# Patient Record
Sex: Female | Born: 1995 | Race: Black or African American | Hispanic: No | Marital: Single | Smoking: Current every day smoker
Health system: Southern US, Community
[De-identification: ages and names within clinical notes are randomized; demographics above are authoritative.]

## PROBLEM LIST (undated history)

## (undated) ENCOUNTER — Inpatient Hospital Stay: Payer: Self-pay

## (undated) DIAGNOSIS — K219 Gastro-esophageal reflux disease without esophagitis: Secondary | ICD-10-CM

## (undated) DIAGNOSIS — F419 Anxiety disorder, unspecified: Secondary | ICD-10-CM

## (undated) DIAGNOSIS — J45909 Unspecified asthma, uncomplicated: Secondary | ICD-10-CM

## (undated) DIAGNOSIS — F329 Major depressive disorder, single episode, unspecified: Secondary | ICD-10-CM

## (undated) DIAGNOSIS — F32A Depression, unspecified: Secondary | ICD-10-CM

## (undated) HISTORY — DX: Anxiety disorder, unspecified: F41.9

## (undated) HISTORY — DX: Gastro-esophageal reflux disease without esophagitis: K21.9

## (undated) HISTORY — DX: Depression, unspecified: F32.A

## (undated) HISTORY — DX: Major depressive disorder, single episode, unspecified: F32.9

## (undated) HISTORY — DX: Unspecified asthma, uncomplicated: J45.909

---
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
## 2015-01-21 ENCOUNTER — Telehealth: Payer: Self-pay | Admitting: Obstetrics and Gynecology

## 2015-01-21 NOTE — Telephone Encounter (Signed)
Needs work note to release her to go back to work, she has appt with [facility] in November for her 6 week post partum however the baby is well over 453 months old and pt never showed for er 6 week post partum visit, but pt stated she needs to go back to work, pt calling and wanting a release back to work note and it needs to state pts name and the day she had her baby, and the end date to be 01/21/15. This is all per pt. Pt stated she needs this done today.

## 2015-01-22 ENCOUNTER — Other Ambulatory Visit: Payer: Self-pay | Admitting: *Deleted

## 2015-01-22 NOTE — Telephone Encounter (Signed)
Note provided for pt, Nicole Goodman faxed to pt

## 2015-01-22 NOTE — Telephone Encounter (Signed)
This pt never showed for her 6 week post partum test, does she need to be seen before note can be giving

## 2015-01-22 NOTE — Telephone Encounter (Signed)
OK to give note , but make sure she knows importance of coming for appointment

## 2015-02-14 ENCOUNTER — Ambulatory Visit: Payer: Medicaid Other | Admitting: Obstetrics and Gynecology

## 2015-12-09 ENCOUNTER — Encounter: Payer: Self-pay | Admitting: Emergency Medicine

## 2015-12-09 ENCOUNTER — Emergency Department
Admission: EM | Admit: 2015-12-09 | Discharge: 2015-12-09 | Disposition: A | Discharge status: Home / Self Care | Payer: Medicaid Other | Attending: Emergency Medicine | Admitting: Emergency Medicine

## 2015-12-09 ENCOUNTER — Ambulatory Visit
Admission: EM | Admit: 2015-12-09 | Discharge: 2015-12-09 | Disposition: A | Discharge status: Home / Self Care | Payer: Medicaid Other | Attending: Internal Medicine | Admitting: Internal Medicine

## 2015-12-09 DIAGNOSIS — Z79899 Other long term (current) drug therapy: Secondary | ICD-10-CM | POA: Insufficient documentation

## 2015-12-09 DIAGNOSIS — J45909 Unspecified asthma, uncomplicated: Secondary | ICD-10-CM | POA: Insufficient documentation

## 2015-12-09 DIAGNOSIS — N939 Abnormal uterine and vaginal bleeding, unspecified: Secondary | ICD-10-CM

## 2015-12-09 DIAGNOSIS — F17210 Nicotine dependence, cigarettes, uncomplicated: Secondary | ICD-10-CM | POA: Insufficient documentation

## 2015-12-09 DIAGNOSIS — Z3A09 9 weeks gestation of pregnancy: Secondary | ICD-10-CM | POA: Diagnosis not present

## 2015-12-09 DIAGNOSIS — N898 Other specified noninflammatory disorders of vagina: Secondary | ICD-10-CM | POA: Insufficient documentation

## 2015-12-09 DIAGNOSIS — O209 Hemorrhage in early pregnancy, unspecified: Secondary | ICD-10-CM | POA: Insufficient documentation

## 2015-12-09 LAB — HCG, QUANTITATIVE, PREGNANCY: hCG, Beta Chain, Quant, S: 33665 m[IU]/mL — ABNORMAL HIGH (ref <5)

## 2015-12-09 LAB — CBC WITH DIFFERENTIAL/PLATELET
Basophils Absolute: 0.1 10*3/uL (ref 0–0.1)
Basophils Relative: 1 %
EOS ABS: 0.1 10*3/uL (ref 0–0.7)
Eosinophils Relative: 1 %
HCT: 37.2 % (ref 35.0–47.0)
Hemoglobin: 13 g/dL (ref 12.0–16.0)
LYMPHS ABS: 3.1 10*3/uL (ref 1.0–3.6)
Lymphocytes Relative: 29 %
MCH: 29.6 pg (ref 26.0–34.0)
MCHC: 34.9 g/dL (ref 32.0–36.0)
MCV: 84.9 fL (ref 80.0–100.0)
MONO ABS: 0.8 10*3/uL (ref 0.2–0.9)
MONOS PCT: 7 %
NEUTROS PCT: 62 %
Neutro Abs: 6.7 10*3/uL — ABNORMAL HIGH (ref 1.4–6.5)
PLATELETS: 210 10*3/uL (ref 150–440)
RBC: 4.39 MIL/uL (ref 3.80–5.20)
RDW: 14.8 % — AB (ref 11.5–14.5)
WBC: 10.7 10*3/uL (ref 3.6–11.0)

## 2015-12-09 LAB — BASIC METABOLIC PANEL
Anion gap: 6 (ref 5–15)
BUN: 8 mg/dL (ref 6–20)
CO2: 26 mmol/L (ref 22–32)
CREATININE: 0.62 mg/dL (ref 0.44–1.00)
Calcium: 9.3 mg/dL (ref 8.9–10.3)
Chloride: 105 mmol/L (ref 101–111)
GFR calc Af Amer: >60 mL/min (ref >60)
Glucose, Bld: 90 mg/dL (ref 65–99)
POTASSIUM: 4.1 mmol/L (ref 3.5–5.1)
SODIUM: 137 mmol/L (ref 135–145)

## 2015-12-09 MED ORDER — IRON (FERROUS GLUCONATE) 256 (28 FE) MG PO TABS
256.0000 mg | ORAL_TABLET | Freq: Every day | ORAL | 1 refills | Status: DC
Start: 2015-12-09 — End: 2016-03-02

## 2015-12-09 MED ORDER — SODIUM CHLORIDE 0.9 % IV BOLUS (SEPSIS)
1000.0000 mL | Freq: Once | INTRAVENOUS | Status: AC
  Administered 2015-12-09: 1000 mL via INTRAVENOUS

## 2015-12-09 MED ORDER — KETOROLAC TROMETHAMINE 30 MG/ML IJ SOLN
30.0000 mg | Freq: Once | INTRAMUSCULAR | Status: AC
  Administered 2015-12-09: 30 mg via INTRAVENOUS
  Filled 2015-12-09: qty 1

## 2015-12-09 NOTE — ED Notes (Signed)
Pt reports she had an abortion 2 days ago and now has increased vaginal bleeding and cramping in lower abdomen.  States 2 pads an hour.  Pt also reports some dizziness today.  Pt alert.  Skin warm and dry.

## 2015-12-09 NOTE — ED Provider Notes (Signed)
[facility]    CSN: [id number] Arrival date & time: 12/09/15  1924  First Provider Contact:  None       History   Chief Complaint Chief Complaint  Patient presents with  . Vaginal Bleeding    HPI Nicole Goodman is a 20 y.o. female.   20 year old female presents to urgent care complaining of heavy vaginal bleeding. The patient underwent an abortion this weekend and states that she has been passing clots the size of a lemon. She is having to change her pad every 2 hours. She has intermittent lightheadedness but denies complaints otherwise.      Past Medical History:  Diagnosis Date  . Anxiety   . Asthma   . Depression   . GERD (gastroesophageal reflux disease)     Patient Active Problem List   Diagnosis Date Noted  . Vaginal delivery 10/02/2014  . Labor and delivery, indication for care 09/30/2014    History reviewed. No pertinent surgical history.  OB History    Gravida Para Term Preterm AB Living   1 1   1   1    SAB TAB Ectopic Multiple Live Births         0 1       Home Medications    Prior to Admission medications   Medication Sig Start Date End Date Taking? Authorizing Provider  Cholecalciferol (VITAMIN D) 2000 UNITS CAPS Take 1 capsule (2,000 Units total) by mouth daily. 10/04/14   Melody N Shambley, CNM  Iron-FA-B Cmp-C-Biot-Probiotic (FUSION PLUS) CAPS Take 1 capsule by mouth daily.    Historical Provider, MD  Prenatal Vit-Fe Fumarate-FA (PRENATAL MULTIVITAMIN) TABS tablet Take 1 tablet by mouth daily.     Historical Provider, MD  sertraline (ZOLOFT) 50 MG tablet Take 50 mg by mouth daily.    Historical Provider, MD    Family History Family History  Problem Relation Age of Onset  . Diabetes Mother   . Cancer Paternal Aunt     breast    Social History Social History  Substance Use Topics  . Smoking status: Never Smoker  . Smokeless tobacco: Never Used  . Alcohol use No     Allergies   Review of patient's allergies  indicates no known allergies.   Review of Systems Review of Systems  Genitourinary: Positive for vaginal bleeding.     Physical Exam Triage Vital Signs ED Triage Vitals  Enc Vitals Group     BP 12/09/15 1940 115/65     Pulse Rate 12/09/15 1940 88     Resp 12/09/15 1940 16     Temp 12/09/15 1940 98.7 F (37.1 C)     Temp Source 12/09/15 1940 Tympanic     SpO2 12/09/15 1940 100 %     Weight 12/09/15 1938 154 lb (69.9 kg)     Height 12/09/15 1938 5\' 4"  (1.626 m)     Head Circumference --      Peak Flow --      Pain Score 12/09/15 1941 8     Pain Loc --      Pain Edu? --      Excl. in GC? --    No data found.   Updated Vital Signs BP 115/65 (BP Location: Left Arm)   Pulse 88   Temp 98.7 F (37.1 C) (Tympanic)   Resp 16   Ht 5\' 4"  (1.626 m)   Wt 154 lb (69.9 kg)   SpO2 100%   BMI 26.43 kg/m  Visual Acuity Right Eye Distance:   Left Eye Distance:   Bilateral Distance:    Right Eye Near:   Left Eye Near:    Bilateral Near:     Physical Exam  Abdominal: Soft. Bowel sounds are normal.     UC Treatments / Results  Labs (all labs ordered are listed, but only abnormal results are displayed) Labs Reviewed - No data to display  EKG  EKG Interpretation None       Radiology No results found.  Procedures Procedures (including critical care time)  Medications Ordered in UC Medications - No data to display   Initial Impression / Assessment and Plan / UC Course  I have reviewed the triage vital signs and the nursing notes.  Pertinent labs & imaging results that were available during my care of the patient were reviewed by me and considered in my medical decision making (see chart for details).  Clinical Course    Vitals stable. Advised evaluation in the emergency department.  Final Clinical Impressions(s) / UC Diagnoses   Final diagnoses:  Vaginal bleeding    New Prescriptions Discharge Medication List as of 12/09/2015  7:44 PM         Arnaldo Natal, MD 12/09/15 1948

## 2015-12-09 NOTE — ED Notes (Signed)
Pelvic exam done.  No specimen to lab.  Pt tolerated well.

## 2015-12-09 NOTE — ED Triage Notes (Signed)
Patient had an abortion on Saturday. Patient states that she has been having significant blood loss and is now having green discharge. Patient states that she is having to change her pad every 30 minutes

## 2015-12-09 NOTE — ED Provider Notes (Signed)
[facility] Emergency Department Provider Note  ____________________________________________  Time seen: Approximately 9:24 PM  I have reviewed the triage vital signs and the nursing notes.   HISTORY  Chief Complaint Vaginal Bleeding   HPI Nicole Goodman is a 20 y.o. female G3 P1 who presents for evaluation of vaginal bleeding. Patient reports that she was seen at an urgent care clinic on Saturday and she was given the first dose of mifepristone. She took the second dose yesterday. Since yesterday she's been having vaginal bleeding and is passing large clots, also having diffuse, intermittent, suprapubic abdominal cramping that radiates to her back. She hasn't tried anything at home for the pain. She denies fever, chest pain, shortness of breath, dysuria, hematuria. She does feel lightheaded. 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 She reports that she was [redacted] weeks pregnant. Patient is O+  Past Medical History:  Diagnosis Date  . Anxiety   . Asthma   . Depression   . GERD (gastroesophageal reflux disease)     Patient Active Problem List   Diagnosis Date Noted  . Vaginal delivery 10/02/2014  . Labor and delivery, indication for care 09/30/2014    History reviewed. No pertinent surgical history.  Prior to Admission medications   Medication Sig Start Date End Date Taking? Authorizing Provider  Cholecalciferol (VITAMIN D) 2000 UNITS CAPS Take 1 capsule (2,000 Units total) by mouth daily. 10/04/14   Melody N Shambley, CNM  Iron, Ferrous Gluconate, 256 (28 Fe) MG TABS Take 256 mg by mouth daily. 12/09/15   Nita Sickle, MD  Iron-FA-B Cmp-C-Biot-Probiotic (FUSION PLUS) CAPS Take 1 capsule by mouth daily.    Historical Provider, MD  Prenatal Vit-Fe Fumarate-FA (PRENATAL MULTIVITAMIN) TABS tablet Take 1 tablet by mouth daily.     Historical Provider, MD  sertraline (ZOLOFT) 50 MG tablet Take 50 mg by mouth  daily.    Historical Provider, MD    Allergies Review of patient's allergies indicates no known allergies.  Family History  Problem Relation Age of Onset  . Diabetes Mother   . Cancer Paternal Aunt     breast    Social History Social History  Substance Use Topics  . Smoking status: Current Every Day Smoker    Packs/day: 0.50    Types: Cigarettes  . Smokeless tobacco: Never Used  . Alcohol use No    Review of Systems  Constitutional: Negative for fever. Eyes: Negative for visual changes. ENT: Negative for sore throat. Cardiovascular: Negative for chest pain. Respiratory: Negative for shortness of breath. Gastrointestinal: Negative forvomiting or diarrhea. + suprapubic cramping Genitourinary: Negative for dysuria. + vaginal bleeding Musculoskeletal: Negative for back pain. Skin: Negative for rash. Neurological: Negative for headaches, weakness or numbness.  ____________________________________________   PHYSICAL EXAM:  VITAL SIGNS: ED Triage Vitals  Enc Vitals Group     BP 12/09/15 2008 126/74     Pulse Rate 12/09/15 2008 (!) 101     Resp 12/09/15 2008 20     Temp 12/09/15 2008 98.1 F (36.7 C)     Temp Source 12/09/15 2008 Oral     SpO2 12/09/15 2008 100 %     Weight 12/09/15 2008 154 lb (69.9 kg)     Height 12/09/15 2008 5\' 4"  (1.626 m)     Head Circumference --      Peak Flow --      Pain Score 12/09/15 2009 7     Pain Loc --  Pain Edu? --      Excl. in GC? --     Constitutional: Alert and oriented. Well appearing and in no apparent distress. HEENT:      Head: Normocephalic and atraumatic.         Eyes: Conjunctivae are normal. Sclera is non-icteric. EOMI. PERRL      Mouth/Throat: Mucous membranes are moist.       Neck: Supple with no signs of meningismus. Cardiovascular: Tachycardic with regular rhythm . No murmurs, gallops, or rubs. 2+ symmetrical distal pulses are present in all extremities. No JVD. Respiratory: Normal respiratory effort.  Lungs are clear to auscultation bilaterally. No wheezes, crackles, or rhonchi.  Gastrointestinal: Soft, non tender, and non distended with positive bowel sounds. No rebound or guarding. Genitourinary: No CVA tenderness. Pelvic exam: Normal external genitalia, no rashes or lesions. Normal cervical mucus. Os open with large clot that was removed with ring forceps. No cervical motion tenderness.  No uterine or adnexal tenderness.   Musculoskeletal: Nontender with normal range of motion in all extremities. No edema, cyanosis, or erythema of extremities. Neurologic: Normal speech and language. Face is symmetric. Moving all extremities. No gross focal neurologic deficits are appreciated. Skin: Skin is warm, dry and intact. No rash noted. Psychiatric: Mood and affect are normal. Speech and behavior are normal.  ____________________________________________   LABS (all labs ordered are listed, but only abnormal results are displayed)  Labs Reviewed  HCG, QUANTITATIVE, PREGNANCY - Abnormal; Notable for the following:       Result Value   hCG, Beta Chain, Quant, S 33,665 (*)    All other components within normal limits  CBC WITH DIFFERENTIAL/PLATELET - Abnormal; Notable for the following:    RDW 14.8 (*)    Neutro Abs 6.7 (*)    All other components within normal limits  BASIC METABOLIC PANEL   ____________________________________________  EKG  none ____________________________________________  RADIOLOGY  none  ____________________________________________   PROCEDURES  Procedure(s) performed: None Procedures Critical Care performed:  None ____________________________________________   INITIAL IMPRESSION / ASSESSMENT AND PLAN / ED COURSE  20 y.o. female G3 P1 who presents for evaluation of vaginal bleeding after taking mifepristone for desired abortion at [redacted] weeks GA.patient is Jehov[employer]'s Witness and does not accept a blood transfusion. She reports she has been passing large clots  since yesterday. Also feeling mildly lightheaded. Also complaining of cramping abdominal pain. She is well appearing, no distress, her abdominal exam is within normal limits, pelvic exam showed an open on with active bleeding and a clot that was removed with ring forceps. Patient was given IV fluids and IV Toradol with marked improvement of her symptoms. She is no longer dizzy. We'll discharge her home with close follow-up with her clinic in [place] in 24-48 hours if she continues to have bleeding. I discussed signs and symptoms of acute blood loss anemia with patient and encourage that she returns if these develop. Also encouraged that she starts taking iron supplementation since patient will not accept a blood transfusion. Her hemoglobin is stable here at 13.0. Her blood type is O+ so no RhoGAM is indicated.   Clinical Course    _________________________ 11:18 PM on 12/09/2015 -----------------------------------------  VS improved with IVF. Patient is feeling markedly improved. We'll discharge home and start patient on iron supplementation. Patient will follow up with her doctor in 24-48 hours if she continues to bleed. I have discussed signs and symptoms of acute blood loss anemia with the patient.  Pertinent labs & imaging  results that were available during my care of the patient were reviewed by me and considered in my medical decision making (see chart for details).    ____________________________________________   FINAL CLINICAL IMPRESSION(S) / ED DIAGNOSES  Final diagnoses:  Vaginal bleeding      NEW MEDICATIONS STARTED DURING THIS VISIT:  New Prescriptions   IRON, FERROUS GLUCONATE, 256 (28 FE) MG TABS    Take 256 mg by mouth daily.     Note:  This document was prepared using Dragon voice recognition software and may include unintentional dictation errors.    Nita Sicklearolina Cosby Proby, MD 12/09/15 [phone]

## 2015-12-09 NOTE — ED Triage Notes (Signed)
Pt presents to ED with heavy vaginal bleeding after taking home abortion pills yesterday [facility] [place]. Pt states she has been bleeding through 2 pads in an hour for about 24 hours in addition to passing 3 "lemon sized clots". Continuing to pass smaller clots this evening. Severe lower abd cramping. States she is feeling dizzy and nauseated.

## 2015-12-09 NOTE — Discharge Instructions (Signed)
Follow-up with her doctor in 2 days. Return to the emergency room if you continue to bleed heavily for 24 hours, if you feel dizzy, chest pain, shortness of breath or if you pass out. Start taking iron supplementations to prevent you from needing a blood transfusion.

## 2016-03-02 ENCOUNTER — Ambulatory Visit
Admission: EM | Admit: 2016-03-02 | Discharge: 2016-03-02 | Disposition: A | Discharge status: Home / Self Care | Payer: Medicaid Other | Attending: Family Medicine | Admitting: Family Medicine

## 2016-03-02 DIAGNOSIS — M5432 Sciatica, left side: Secondary | ICD-10-CM

## 2016-03-02 DIAGNOSIS — S39012A Strain of muscle, fascia and tendon of lower back, initial encounter: Secondary | ICD-10-CM | POA: Diagnosis not present

## 2016-03-02 MED ORDER — CYCLOBENZAPRINE HCL 10 MG PO TABS: 10.0000 mg | ORAL_TABLET | Freq: Every day | ORAL | 0 refills | Status: DC

## 2016-03-02 MED ORDER — PREDNISONE 20 MG PO TABS: 20.0000 mg | ORAL_TABLET | Freq: Every day | ORAL | 0 refills | Status: DC

## 2016-03-02 MED ORDER — HYDROCODONE-ACETAMINOPHEN 5-325 MG PO TABS: ORAL_TABLET | ORAL | 0 refills | Status: DC

## 2016-03-02 NOTE — ED Triage Notes (Addendum)
Put states she pulled a muscle in her back picking up her daughter yesterday. Left lower side and radiates down her leg. The muscle feels tight.

## 2016-03-02 NOTE — ED Provider Notes (Signed)
[facility]    CSN: [id number] Arrival date & time: 03/02/16  1314     History   Chief Complaint Chief Complaint  Patient presents with  . Back Pain    HPI Nicole Goodman is a 20 y.o. female.   The history is provided by the patient.  Back Pain  Location:  Lumbar spine Quality:  Aching Radiates to:  L posterior upper leg Pain severity:  Moderate Pain is:  Same all the time Duration:  2 days Timing:  Constant Progression:  Unchanged Chronicity:  New Context: lifting heavy objects (lifting daughter)   Relieved by:  Nothing Ineffective treatments:  NSAIDs Associated symptoms: leg pain   Associated symptoms: no abdominal pain, no abdominal swelling, no bladder incontinence, no bowel incontinence, no chest pain, no dysuria, no fever, no headaches, no numbness, no paresthesias, no pelvic pain, no perianal numbness, no tingling, no weakness and no weight loss   Risk factors: no hx of cancer, not pregnant and no recent surgery     Past Medical History:  Diagnosis Date  . Anxiety   . Asthma   . Depression   . GERD (gastroesophageal reflux disease)     Patient Active Problem List   Diagnosis Date Noted  . Vaginal delivery 10/02/2014  . Labor and delivery, indication for care 09/30/2014    History reviewed. No pertinent surgical history.  OB History    Gravida Para Term Preterm AB Living   1 1   1   1    SAB TAB Ectopic Multiple Live Births         0 1       Home Medications    Prior to Admission medications   Medication Sig Start Date End Date Taking? Authorizing Provider  cyclobenzaprine (FLEXERIL) 10 MG tablet Take 1 tablet (10 mg total) by mouth at bedtime. 03/02/16   Payton Mccallumrlando Orian Figueira, MD  HYDROcodone-acetaminophen (NORCO/VICODIN) 5-325 MG tablet 1-2 tabs po qhs prn 03/02/16   Payton Mccallumrlando Yatzary Merriweather, MD  predniSONE (DELTASONE) 20 MG tablet Take 1 tablet (20 mg total) by mouth daily. 03/02/16   Payton Mccallumrlando Leyla Soliz, MD    Family History Family History    Problem Relation Age of Onset  . Diabetes Mother   . Cancer Paternal Aunt     breast    Social History Social History  Substance Use Topics  . Smoking status: Current Every Day Smoker    Packs/day: 0.50    Types: Cigarettes  . Smokeless tobacco: Never Used  . Alcohol use No     Allergies   Patient has no known allergies.   Review of Systems Review of Systems  Constitutional: Negative for fever and weight loss.  Cardiovascular: Negative for chest pain.  Gastrointestinal: Negative for abdominal pain and bowel incontinence.  Genitourinary: Negative for bladder incontinence, dysuria and pelvic pain.  Musculoskeletal: Positive for back pain.  Neurological: Negative for tingling, weakness, numbness, headaches and paresthesias.     Physical Exam Triage Vital Signs ED Triage Vitals  Enc Vitals Group     BP 03/02/16 1454 119/74     Pulse Rate 03/02/16 1454 71     Resp 03/02/16 1454 18     Temp 03/02/16 1454 98.1 F (36.7 C)     Temp Source 03/02/16 1454 Oral     SpO2 03/02/16 1454 100 %     Weight 03/02/16 1455 150 lb (68 kg)     Height 03/02/16 1455 5\' 4"  (1.626 m)     Head  Circumference --      Peak Flow --      Pain Score 03/02/16 1456 8     Pain Loc --      Pain Edu? --      Excl. in GC? --    No data found.   Updated Vital Signs BP 119/74 (BP Location: Left Arm)   Pulse 71   Temp 98.1 F (36.7 C) (Oral)   Resp 18   Ht 5\' 4"  (1.626 m)   Wt 150 lb (68 kg)   LMP 02/24/2016   SpO2 100%   BMI 25.75 kg/m   Visual Acuity Right Eye Distance:   Left Eye Distance:   Bilateral Distance:    Right Eye Near:   Left Eye Near:    Bilateral Near:     Physical Exam  Constitutional: She appears well-developed and well-nourished. No distress.  Musculoskeletal: She exhibits tenderness. She exhibits no edema.       Lumbar back: She exhibits tenderness (over the left lumbar paraspinous muscles) and spasm. She exhibits normal range of motion, no bony tenderness,  no swelling, no edema, no deformity, no laceration, no pain and normal pulse.       Back:  Neurological: She is alert. She has normal reflexes. She displays normal reflexes. She exhibits normal muscle tone.  Skin: Skin is warm and dry. No rash noted. She is not diaphoretic. No erythema.  Nursing note and vitals reviewed.    UC Treatments / Results  Labs (all labs ordered are listed, but only abnormal results are displayed) Labs Reviewed - No data to display  EKG  EKG Interpretation None       Radiology No results found.  Procedures Procedures (including critical care time)  Medications Ordered in UC Medications - No data to display   Initial Impression / Assessment and Plan / UC Course  I have reviewed the triage vital signs and the nursing notes.  Pertinent labs & imaging results that were available during my care of the patient were reviewed by me and considered in my medical decision making (see chart for details).  Clinical Course       Final Clinical Impressions(s) / UC Diagnoses   Final diagnoses:  Strain of lumbar region, initial encounter  Sciatica of left side    New Prescriptions Discharge Medication List as of 03/02/2016  3:10 PM    START taking these medications   Details  cyclobenzaprine (FLEXERIL) 10 MG tablet Take 1 tablet (10 mg total) by mouth at bedtime., Starting Mon 03/02/2016, Normal    HYDROcodone-acetaminophen (NORCO/VICODIN) 5-325 MG tablet 1-2 tabs po qhs prn, Print    predniSONE (DELTASONE) 20 MG tablet Take 1 tablet (20 mg total) by mouth daily., Starting Mon 03/02/2016, Normal       1. diagnosis reviewed with patient 2. rx as per orders above; reviewed possible side effects, interactions, risks and benefits  3. Recommend supportive treatment with heat, stretches 4. Follow-up prn if symptoms worsen or don't improve   Payton Mccallumrlando Aryani Daffern, MD 03/02/16 [phone]

## 2016-03-13 ENCOUNTER — Ambulatory Visit
Admission: EM | Admit: 2016-03-13 | Discharge: 2016-03-13 | Disposition: A | Discharge status: Home / Self Care | Payer: Medicaid Other | Attending: Family Medicine | Admitting: Family Medicine

## 2016-03-13 ENCOUNTER — Encounter: Payer: Self-pay | Admitting: Emergency Medicine

## 2016-03-13 NOTE — ED Triage Notes (Signed)
Patient c/o sinus pain and pressure, bilateral ear pain, HAs, and runny nose for 2 days.  Patient denies fevers.

## 2016-03-14 ENCOUNTER — Ambulatory Visit
Admission: EM | Admit: 2016-03-14 | Discharge: 2016-03-14 | Discharge status: Absent without leave | Payer: Medicaid Other
